# Patient Record
Sex: Female | Born: 1990 | Race: Black or African American | Hispanic: No | Marital: Single | Smoking: Former smoker
Health system: Southern US, Community
[De-identification: ages and names within clinical notes are randomized; demographics above are authoritative.]

## PROBLEM LIST (undated history)

## (undated) HISTORY — PX: TONSILLECTOMY: SUR1361

## (undated) HISTORY — PX: ANTERIOR CRUCIATE LIGAMENT REPAIR: SHX115

---

## 2012-02-23 ENCOUNTER — Encounter (HOSPITAL_COMMUNITY): Payer: Self-pay | Admitting: *Deleted

## 2012-02-23 ENCOUNTER — Emergency Department (HOSPITAL_COMMUNITY): Payer: BC Managed Care – PPO

## 2012-02-23 ENCOUNTER — Emergency Department (HOSPITAL_COMMUNITY)
Admission: EM | Admit: 2012-02-23 | Discharge: 2012-02-24 | Disposition: A | Discharge status: Home / Self Care | Payer: BC Managed Care – PPO | Attending: Emergency Medicine | Admitting: Emergency Medicine

## 2012-02-23 DIAGNOSIS — Y939 Activity, unspecified: Secondary | ICD-10-CM | POA: Insufficient documentation

## 2012-02-23 DIAGNOSIS — Y9210 Unspecified residential institution as the place of occurrence of the external cause: Secondary | ICD-10-CM | POA: Insufficient documentation

## 2012-02-23 DIAGNOSIS — W230XXA Caught, crushed, jammed, or pinched between moving objects, initial encounter: Secondary | ICD-10-CM | POA: Insufficient documentation

## 2012-02-23 DIAGNOSIS — S62639A Displaced fracture of distal phalanx of unspecified finger, initial encounter for closed fracture: Secondary | ICD-10-CM | POA: Insufficient documentation

## 2012-02-23 DIAGNOSIS — S6710XA Crushing injury of unspecified finger(s), initial encounter: Secondary | ICD-10-CM | POA: Insufficient documentation

## 2012-02-23 MED ORDER — HYDROCODONE-ACETAMINOPHEN 5-325 MG PO TABS
2.0000 | ORAL_TABLET | Freq: Once | ORAL | Status: AC
  Administered 2012-02-24: 2 via ORAL
  Filled 2012-02-23: qty 2

## 2012-02-23 NOTE — ED Provider Notes (Signed)
History  Scribed for Glade Nurse, PA-C/ Lyanne Co, MD, the patient was seen in room WTR5/WTR5. This chart was scribed by Candelaria Stagers. The patient's care started at 11:21 PM   CSN: [id number]  Arrival date & time 02/23/12  2143   None     Chief Complaint  Patient presents with  . Finger Injury     The history is provided by the patient. No language interpreter was used.   Jillian Henderson is a 22 y.o. female who presents to the Emergency Department complaining of bleeding and pain to the right middle finger after slamming the finger in a dorm door earlier today.  Pt describes the pain as 8/10.  She has no other injuries.  She reports numbness to the tip of the right middle finger. Pt denies nausea, vomiting, fever, or shills.  She has taken nothing for the pain.    History reviewed. No pertinent past medical history.  Past Surgical History  Procedure Date  . Tonsillectomy   . Anterior cruciate ligament repair     No family history on file.  History  Substance Use Topics  . Smoking status: Never Smoker   . Smokeless tobacco: Not on file  . Alcohol Use: No    OB History    Grav Para Term Preterm Abortions TAB SAB Ect Mult Living                  Review of Systems  Constitutional: Negative for fever and chills.  Respiratory: Negative for shortness of breath.   Cardiovascular: Negative for chest pain.  Gastrointestinal: Negative for nausea, vomiting and abdominal pain.  Musculoskeletal: Positive for arthralgias (pain and avulsion of nail to the right middle finger).  Neurological: Negative for weakness.  All other systems reviewed and are negative.    Allergies  Review of patient's allergies indicates no known allergies.  Home Medications  No current outpatient prescriptions on file.  BP 126/77  Pulse 78  Temp 98.9 F (37.2 C) (Oral)  Resp 16  SpO2 100%  LMP 01/27/2012  Physical Exam  Nursing note and vitals reviewed. Constitutional: She is  oriented to person, place, and time. She appears well-developed and well-nourished. No distress.  HENT:  Head: Normocephalic and atraumatic.  Eyes: EOM are normal.  Neck: Neck supple. No tracheal deviation present.  Cardiovascular: Normal rate.   Pulmonary/Chest: Effort normal. No respiratory distress.  Musculoskeletal: Normal range of motion.       Avulsion of the nail plate to the left middle finger tuft.  Bruised.  Swollen.  Full ROM.  Decreased sensation to the left middle finger.  Normal sensation to all other fingers.  Distal pulses intact.    Neurological: She is alert and oriented to person, place, and time.  Skin: Skin is warm and dry.  Psychiatric: She has a normal mood and affect. Her behavior is normal.    ED Course  Procedures   DIAGNOSTIC STUDIES: Oxygen Saturation is 100% on room air, normal by my interpretation.    COORDINATION OF CARE: 10:43PM Ordered: DG Finger Middle Left   12:00AM Ordered: HYDROcodone-acetaminophen (NORCO/VICODIN) 5-325 MG per tablet 2 tablet.    Labs Reviewed - No data to display Dg Finger Middle Left  02/23/2012  *RADIOLOGY REPORT*  Clinical Data: Injury to the middle finger.  LEFT MIDDLE FINGER 2+V  Comparison: None.  Findings: Three views of the middle finger demonstrate a crush type fracture of the distal phalanx tuft.  There are no other fractures.  The fractures are mildly displaced.  IMPRESSION: Crush fracture to the middle finger tuft.   Original Report Authenticated By: Richarda Overlie, M.D.      Diagnosis: crush fracture with nail avulsion, left hand, third digit.    MDM  Pain managed in ED. Pressure irrigation performed. Laceration occurred < 8 hours prior to repair which was well tolerated. Xray shows crush fracture. Nail totally avulsed. Dr. Patria Mane consulted and performed avulsion repair to nail bed. Bulky dressing and splint applied.  Pt has no co morbidities to effect normal wound healing. Discussed suture home care w pt and  answered questions. Pt to f-u to ensure proper nail growth. Pt is hemodynamically stable w no complaints prior to dc.     Glade Nurse, PA-C 02/25/12 [phone]

## 2012-02-23 NOTE — ED Notes (Signed)
Pt slammed left hand middle finger in dorm door; nail has come off

## 2012-02-24 MED ORDER — HYDROCODONE-ACETAMINOPHEN 5-325 MG PO TABS: ORAL_TABLET | ORAL | Status: AC

## 2012-02-24 NOTE — ED Notes (Signed)
Pt reports slamming her left third digit in the door earlier this evening, pt w/ nail avulsion and severe third digit pain. Bleeding controlled at present.

## 2012-02-24 NOTE — ED Notes (Signed)
Rx given x1 Pt ambulating independently w/ steady gait on d/c in no acute distress, A&Ox4. D/c instructions reviewed w/ pt and family - pt and family deny any further questions or concerns at present.  

## 2012-02-25 NOTE — ED Provider Notes (Signed)
Medical screening examination/treatment/procedure(s) were conducted as a shared visit with non-physician practitioner(s) and myself.  I personally evaluated the patient during the encounter  Please see my other note with documentation of nerve block and nail bed repair  Lyanne Co, MD 02/25/12 [phone]

## 2012-02-25 NOTE — ED Provider Notes (Signed)
Medical screening examination/treatment/procedure(s) were conducted as a shared visit with non-physician practitioner(s) and myself.  I personally evaluated the patient during the encounter  NERVE BLOCK Performed by: Lyanne Co Consent: Verbal consent obtained. Required items: required blood products, implants, devices, and special equipment available Time out: Immediately prior to procedure a "time out" was called to verify the correct patient, procedure, equipment, support staff and site/side marked as required. Indication: nail repair Nerve block body site: digital nerves of left middle finger Preparation: Patient was prepped and draped in the usual sterile fashion. Needle gauge: 24 G Location technique: anatomical landmarks Local anesthetic: lidocaine 2% without epi Anesthetic total: 4 ml Outcome: pain improved Patient tolerance: Patient tolerated the procedure well with no immediate complications.   NAIL REPAIR Performed by: Lyanne Co Consent: Verbal consent obtained. Risks and benefits: risks, benefits and alternatives were discussed Patient identity confirmed: provided demographic data Time out performed prior to procedure Prepped and Draped in normal sterile fashion Wound explored Nail Location: left middle finger Description: complete loss of nail overlying nail bed with partial loss of nail bed, no active bleeding No Foreign Bodies seen or palpated Anesthesia: digital nerve (see note) Irrigation method: syringe Amount of cleaning: standard Repair: Foil from chromic wrapper used to approximate nail, placed under the nail fold and sutured into placed with two 5-0 chromic interrupted sutures Patient tolerance: Patient tolerated the procedure well with no immediate complications.   Lyanne Co, MD 02/25/12 [phone]

## 2013-12-31 ENCOUNTER — Encounter: Payer: Self-pay | Admitting: Neurology

## 2013-12-31 ENCOUNTER — Ambulatory Visit (INDEPENDENT_AMBULATORY_CARE_PROVIDER_SITE_OTHER): Payer: BC Managed Care – PPO | Admitting: Neurology

## 2013-12-31 VITALS — BP 112/70 | HR 72 | Temp 98.2°F | Resp 16 | Ht 67.0 in | Wt 234.4 lb

## 2013-12-31 DIAGNOSIS — G44209 Tension-type headache, unspecified, not intractable: Secondary | ICD-10-CM

## 2013-12-31 DIAGNOSIS — G510 Bell's palsy: Secondary | ICD-10-CM

## 2013-12-31 NOTE — Progress Notes (Signed)
NEUROLOGY CONSULTATION NOTE  Jillian Henderson MRN: [id number] DOB: December 18, 1990  Referring provider: Marva PandaKimberly Millsaps, NP Primary care provider:Kimberly Fredrik CoveMillsaps, NP  Reason for consult:  Bell's palsy  HISTORY OF PRESENT ILLNESS: Jillian Henderson is a 23 year old left-handed woman who presents for evaluation of Bell's palsy.  Records and MRI report and scans reviewed.  She is accompanied by her mother.  On 12/17/13, she woke up and noted right facial weakness.  She went to the ED where she was diagnosed with Bell's palsy.  She was prescribed prednisone and Valtrex.  The following day, she developed a headache on the right side in the back of her head.  It is a pressure and pounding headache, about 8/10 and not associated with other symptoms such as nausea.  It appears to be triggered by photosensitivity in her right eye.  She has been taking tramadol which helps.  She continues to have right facial weakness, making it difficult to eat, drink and speak.  She also cannot close her right eye and she will occasionally have monocular blurred vision in the morning.  She sleeps with a patch and uses eye drops during the day.  She reports right ear pain and hyperacusis on the right.  She has not noticed any change in taste.  She does not endorse facial numbness. She had an MRI of the brain performed on 12/30/13, which was unremarkable.  However, it was performed without contrast.  She denies recent fever or viral illness. She saw the optometrist yesterday and her eye looks fine.  She has a prior history of mild right-sided Bell's palsy in 4th grade, lasting only a week.  She has no history of migraine.  She has no family history of Bell's palsy or migraine.  PAST MEDICAL HISTORY: No past medical history on file.  PAST SURGICAL HISTORY: Past Surgical History  Procedure Laterality Date  . Tonsillectomy    . Anterior cruciate ligament repair      MEDICATIONS: Current Outpatient Prescriptions on File Prior  to Visit  Medication Sig Dispense Refill  . HYDROcodone-acetaminophen (NORCO/VICODIN) 5-325 MG per tablet Take 1-2 tablets every 4-6 hours as needed for pain (Patient not taking: Reported on 12/31/2013) 15 tablet 0   No current facility-administered medications on file prior to visit.    ALLERGIES: No Known Allergies  FAMILY HISTORY: Family History  Problem Relation Age of Onset  . Diabetes Father   . Asthma Brother   . Diabetes Maternal Grandmother   . Hypertension Maternal Grandmother   . Hypertension Maternal Grandfather   . Diabetes Maternal Grandfather     stomach /liver    SOCIAL HISTORY: History   Social History  . Marital Status: Single    Spouse Name: N/A    Number of Children: N/A  . Years of Education: N/A   Occupational History  . Not on file.   Social History Main Topics  . Smoking status: Never Smoker   . Smokeless tobacco: Not on file  . Alcohol Use: No     Comment: social   . Drug Use: No  . Sexual Activity: No   Other Topics Concern  . Not on file   Social History Narrative    REVIEW OF SYSTEMS: Constitutional: No fevers, chills, or sweats, no generalized fatigue, change in appetite Eyes: intermittent blurred vision in right eye Ear, nose and throat: Right hyperacusis Cardiovascular: No chest pain, palpitations Respiratory:  No shortness of breath at rest or with exertion, wheezes GastrointestinaI: No nausea,  vomiting, diarrhea, abdominal pain, fecal incontinence Genitourinary:  No dysuria, urinary retention or frequency Musculoskeletal:  No neck pain, back pain Integumentary: No rash, pruritus, skin lesions Neurological: as above Psychiatric: No depression, insomnia, anxiety Endocrine: No palpitations, fatigue, diaphoresis, mood swings, change in appetite, change in weight, increased thirst Hematologic/Lymphatic:  No anemia, purpura, petechiae. Allergic/Immunologic: no itchy/runny eyes, nasal congestion, recent allergic reactions,  rashes  PHYSICAL EXAM: Filed Vitals:   12/31/13 1046  BP: 112/70  Pulse: 72  Temp: 98.2 F (36.8 C)  Resp: 16   General: No acute distress Head:  Normocephalic/atraumatic Eyes:  fundi unremarkable, without vessel changes, exudates, hemorrhages or papilledema. Neck: supple, no paraspinal tenderness, full range of motion Back: No paraspinal tenderness Heart: regular rate and rhythm Lungs: Clear to auscultation bilaterally. Vascular: No carotid bruits. Neurological Exam: Mental status: alert and oriented to person, place, and time, recent and remote memory intact, fund of knowledge intact, attention and concentration intact, speech fluent and not dysarthric, language intact. Cranial nerves: CN I: not tested CN II: pupils equal, round and reactive to light, visual fields intact, fundi unremarkable, without vessel changes, exudates, hemorrhages or papilledema. CN III, IV, VI:  full range of motion, no nystagmus, no ptosis CN V: facial sensation intact CN VII: right upper and lower facial weakness CN VIII: right hyperacusis CN IX, X: gag intact, uvula midline CN XI: sternocleidomastoid and trapezius muscles intact CN XII: tongue midline Bulk & Tone: normal, no fasciculations. Motor:  5/5 throughout Sensation:  Temperature and vibration intact Deep Tendon Reflexes:  2+ throughout, toes downgoing Finger to nose testing:  No dysmetria Heel to shin:  No dysmetria Gait:  Normal station and stride.  Able to turn and walk in tandem. Romberg negative.  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  At this point, we will wait and see how you do.  Follow up in 3 months and we can decide if further testing is warranted. 2.  At night, use LacriLube in your eye before putting on the gauze and eye patch to keep your eye moist at night.  [facility].  Continue eye drops during the  day. 4.  Limit use of pain relievers to no more than 2 days out of the week if possible   Thank you for allowing me to take part in the care of this patient.  Shon MilletAdam Miyah Hampshire, DO  CC: Marva PandaKimberly Millsaps, NP

## 2013-12-31 NOTE — Patient Instructions (Addendum)
I think you have a Bell's palsy.  MRI of the brain looked okay.   1.  At this point, we will wait and see how you do.  Follow up in 3 months and we can decide if further testing is warranted. 2.  At night, use LacriLube in your eye before putting on the gauze and eye patch to keep your eye moist at night.  [facility].  Continue eye drops during the day. 4.  Limit use of pain relievers to no more than 2 days out of the week if possible

## 2014-04-04 ENCOUNTER — Encounter: Payer: Self-pay | Admitting: Neurology

## 2014-04-04 ENCOUNTER — Ambulatory Visit (INDEPENDENT_AMBULATORY_CARE_PROVIDER_SITE_OTHER): Payer: BLUE CROSS/BLUE SHIELD | Admitting: Neurology

## 2014-04-04 VITALS — BP 104/70 | HR 70 | Resp 16 | Ht 67.0 in | Wt 236.1 lb

## 2014-04-04 DIAGNOSIS — G510 Bell's palsy: Secondary | ICD-10-CM

## 2014-04-04 NOTE — Patient Instructions (Signed)
1.  I would continue eye drops and wearing a patch at night 2.  Follow up in 2 months

## 2014-04-04 NOTE — Progress Notes (Signed)
NEUROLOGY FOLLOW UP OFFICE NOTE  Jillian Henderson [id number]  HISTORY OF PRESENT ILLNESS: Jillian Henderson is a 24 year old left-handed woman who follows up for right Bell's palsy.  UPDATE: Facial weakness still present but improved.  She doesn't have as much trouble drinking or talking.  Her eye still doesn't close well.  She denies new symptoms.  No headache.  No eye pain.  Occasional blurred vision. HISTORY: On 12/17/13, she woke up and noted right facial weakness.  She went to the ED where she was diagnosed with Bell's palsy.  She was prescribed prednisone and Valtrex.  The following day, she developed a headache on the right side in the back of her head.  It is a pressure and pounding headache, about 8/10 and not associated with other symptoms such as nausea.  It appears to be triggered by photosensitivity in her right eye.  She has been taking tramadol which helps.  She continues to have right facial weakness, making it difficult to eat, drink and speak.  She also cannot close her right eye and she will occasionally have monocular blurred vision in the morning.  She sleeps with a patch and uses eye drops during the day.  She reports right ear pain and hyperacusis on the right.  She has not noticed any change in taste.  She does not endorse facial numbness. She had an MRI of the brain performed on 12/30/13, which was unremarkable.  However, it was performed without contrast.  She denies recent fever or viral illness. She saw the optometrist yesterday and her eye looks fine.  She has a prior history of mild right-sided Bell's palsy in 4th grade, lasting only a week.  She has no history of migraine.  She has no family history of Bell's palsy or migraine.  PAST MEDICAL HISTORY: No past medical history on file.  MEDICATIONS: Current Outpatient Prescriptions on File Prior to Visit  Medication Sig Dispense Refill  . HYDROcodone-acetaminophen (NORCO/VICODIN) 5-325 MG per tablet Take 1-2 tablets every 4-6  hours as needed for pain 15 tablet 0  . traMADol (ULTRAM) 50 MG tablet Take by mouth every 6 (six) hours as needed.     No current facility-administered medications on file prior to visit.    ALLERGIES: No Known Allergies  FAMILY HISTORY: Family History  Problem Relation Age of Onset  . Diabetes Father   . Asthma Brother   . Diabetes Maternal Grandmother   . Hypertension Maternal Grandmother   . Hypertension Maternal Grandfather   . Diabetes Maternal Grandfather     stomach /liver    SOCIAL HISTORY: History   Social History  . Marital Status: Single    Spouse Name: N/A  . Number of Children: N/A  . Years of Education: N/A   Occupational History  . Not on file.   Social History Main Topics  . Smoking status: Former Games developermoker  . Smokeless tobacco: Never Used  . Alcohol Use: No     Comment: social   . Drug Use: No  . Sexual Activity: No   Other Topics Concern  . Not on file   Social History Narrative    REVIEW OF SYSTEMS: Constitutional: No fevers, chills, or sweats, no generalized fatigue, change in appetite Eyes: No visual changes, double vision, eye pain Ear, nose and throat: No hearing loss, ear pain, nasal congestion, sore throat Cardiovascular: No chest pain, palpitations Respiratory:  No shortness of breath at rest or with exertion, wheezes GastrointestinaI: No nausea, vomiting, diarrhea,  abdominal pain, fecal incontinence Genitourinary:  No dysuria, urinary retention or frequency Musculoskeletal:  No neck pain, back pain Integumentary: No rash, pruritus, skin lesions Neurological: as above Psychiatric: No depression, insomnia, anxiety Endocrine: No palpitations, fatigue, diaphoresis, mood swings, change in appetite, change in weight, increased thirst Hematologic/Lymphatic:  No anemia, purpura, petechiae. Allergic/Immunologic: no itchy/runny eyes, nasal congestion, recent allergic reactions, rashes  PHYSICAL EXAM: Filed Vitals:   04/04/14 1115  BP:  104/70  Pulse: 70  Resp: 16   General: No acute distress Head:  Normocephalic/atraumatic Eyes:  Fundoscopic exam unremarkable without vessel changes, exudates, hemorrhages or papilledema. Neck: supple, no paraspinal tenderness, full range of motion Heart:  Regular rate and rhythm Lungs:  Clear to auscultation bilaterally Back: No paraspinal tenderness Neurological Exam: alert and oriented to person, place, and time. Attention span and concentration intact, recent and remote memory intact, fund of knowledge intact.  Speech fluent and not dysarthric, language intact.  Right upper and lower facial weakness, otherwise CN II-XII intact. Fundoscopic exam unremarkable without vessel changes, exudates, hemorrhages or papilledema.  Bulk and tone normal, muscle strength 5/5 throughout.  Sensation to light touch  intact.  Deep tendon reflexes 2+ throughout.  Finger to nose testing intact.  Gait normal  IMPRESSION: Right Bell's palsy.  She has shown improvement which is reassuring.  I really don't suspect another etiology since it has been improving.  She still cannot fully close her eye though.    PLAN: I would still like to evaluate her again in 2 months at the 6 month mark Recommend keeping eyes lubricated and wearing patch at night.  15 minutes spent with patient, over 50% spent discussing diagnosis and plan.  Shon Millet, DO  CC: Marva Panda, NP

## 2015-07-05 ENCOUNTER — Emergency Department (HOSPITAL_COMMUNITY)
Admission: EM | Admit: 2015-07-05 | Discharge: 2015-07-06 | Disposition: A | Discharge status: Home / Self Care | Payer: BLUE CROSS/BLUE SHIELD | Attending: Emergency Medicine | Admitting: Emergency Medicine

## 2015-07-05 DIAGNOSIS — Y999 Unspecified external cause status: Secondary | ICD-10-CM | POA: Diagnosis not present

## 2015-07-05 DIAGNOSIS — Z87891 Personal history of nicotine dependence: Secondary | ICD-10-CM | POA: Diagnosis not present

## 2015-07-05 DIAGNOSIS — Y929 Unspecified place or not applicable: Secondary | ICD-10-CM | POA: Diagnosis not present

## 2015-07-05 DIAGNOSIS — Z79891 Long term (current) use of opiate analgesic: Secondary | ICD-10-CM | POA: Insufficient documentation

## 2015-07-05 DIAGNOSIS — S20219A Contusion of unspecified front wall of thorax, initial encounter: Secondary | ICD-10-CM | POA: Insufficient documentation

## 2015-07-05 DIAGNOSIS — Y9389 Activity, other specified: Secondary | ICD-10-CM | POA: Insufficient documentation

## 2015-07-05 DIAGNOSIS — W500XXA Accidental hit or strike by another person, initial encounter: Secondary | ICD-10-CM | POA: Insufficient documentation

## 2015-07-05 DIAGNOSIS — S299XXA Unspecified injury of thorax, initial encounter: Secondary | ICD-10-CM | POA: Diagnosis present

## 2015-07-05 DIAGNOSIS — Z791 Long term (current) use of non-steroidal anti-inflammatories (NSAID): Secondary | ICD-10-CM | POA: Insufficient documentation

## 2015-07-06 ENCOUNTER — Encounter (HOSPITAL_COMMUNITY): Payer: Self-pay | Admitting: Emergency Medicine

## 2015-07-06 ENCOUNTER — Emergency Department (HOSPITAL_COMMUNITY): Payer: BLUE CROSS/BLUE SHIELD

## 2015-07-06 MED ORDER — KETOROLAC TROMETHAMINE 30 MG/ML IJ SOLN: 30.0000 mg | Freq: Once | INTRAMUSCULAR | Status: DC

## 2015-07-06 MED ORDER — IBUPROFEN 200 MG PO TABS
600.0000 mg | ORAL_TABLET | Freq: Once | ORAL | Status: AC
  Administered 2015-07-06: 600 mg via ORAL
  Filled 2015-07-06: qty 3

## 2015-07-06 MED ORDER — NAPROXEN 500 MG PO TABS: 500.0000 mg | ORAL_TABLET | Freq: Two times a day (BID) | ORAL | Status: AC
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
## 2015-07-06 NOTE — Discharge Instructions (Signed)
Chest Contusion A chest contusion is a deep bruise on your chest area. Contusions are the result of an injury that caused bleeding under the skin. A chest contusion may involve bruising of the skin, muscles, or ribs. The contusion may turn blue, purple, or yellow. Minor injuries will give you a painless contusion, but more severe contusions may stay painful and swollen for a few weeks. CAUSES  A contusion is usually caused by a blow, trauma, or direct force to an area of the body. SYMPTOMS   Swelling and redness of the injured area.  Discoloration of the injured area.  Tenderness and soreness of the injured area.  Pain. DIAGNOSIS  The diagnosis can be made by taking a history and performing a physical exam. An X-ray, CT scan, or MRI may be needed to determine if there were any associated injuries, such as broken bones (fractures) or internal injuries. TREATMENT  Often, the best treatment for a chest contusion is resting, icing, and applying cold compresses to the injured area. Deep breathing exercises may be recommended to reduce the risk of pneumonia. Over-the-counter medicines may also be recommended for pain control. HOME CARE INSTRUCTIONS   Put ice on the injured area.  Put ice in a plastic bag.  Place a towel between your skin and the bag.  Leave the ice on for 15-20 minutes, 03-04 times a day.  Only take over-the-counter or prescription medicines as directed by your caregiver. Your caregiver may recommend avoiding anti-inflammatory medicines (aspirin, ibuprofen, and naproxen) for 48 hours because these medicines may increase bruising.  Rest the injured area.  Perform deep-breathing exercises as directed by your caregiver.  Stop smoking if you smoke.  Do not lift objects over 5 pounds (2.3 kg) for 3 days or longer if recommended by your caregiver. SEEK IMMEDIATE MEDICAL CARE IF:   You have increased bruising or swelling.  You have pain that is getting worse.  You have  difficulty breathing.  You have dizziness, weakness, or fainting.  You have blood in your urine or stool.  You cough up or vomit blood.  Your swelling or pain is not relieved with medicines. MAKE SURE YOU:   Understand these instructions.  Will watch your condition.  Will get help right away if you are not doing well or get worse.   This information is not intended to replace advice given to you by your health care provider. Make sure you discuss any questions you have with your health care provider.   Document Released: 10/12/2000 Document Revised: 10/12/2011 Document Reviewed: 07/11/2011 [employer] Interactive Patient Education 2016 [employer]  

## 2017-09-09 IMAGING — CR DG CHEST 2V
2 series · 2 of 2 positions shown · non-contrast
Comparison: None.

CLINICAL DATA: Chest wall injury with sternal pain. Initial
encounter.

EXAM:
CHEST  2 VIEW

[w chest pa]
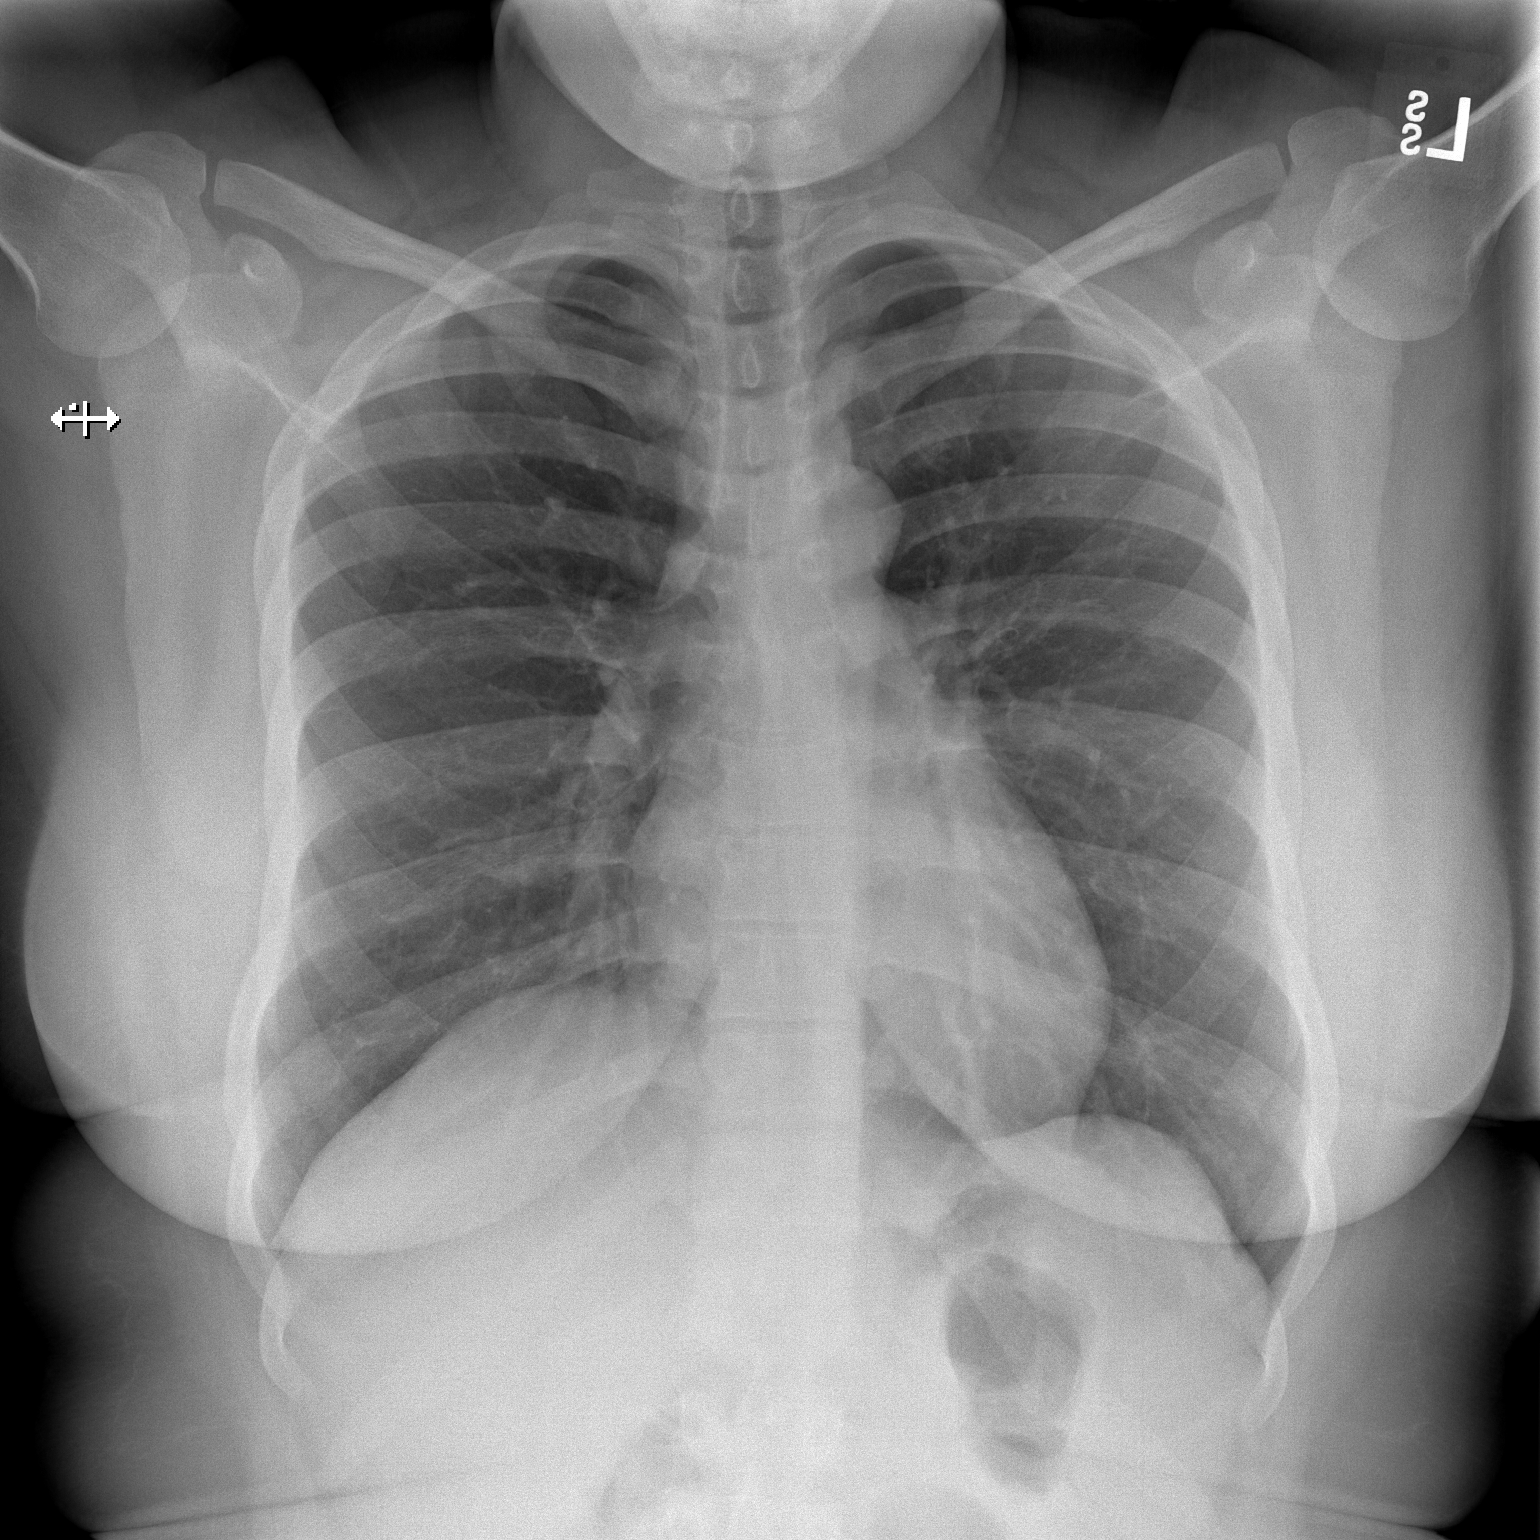

[w chest lat]
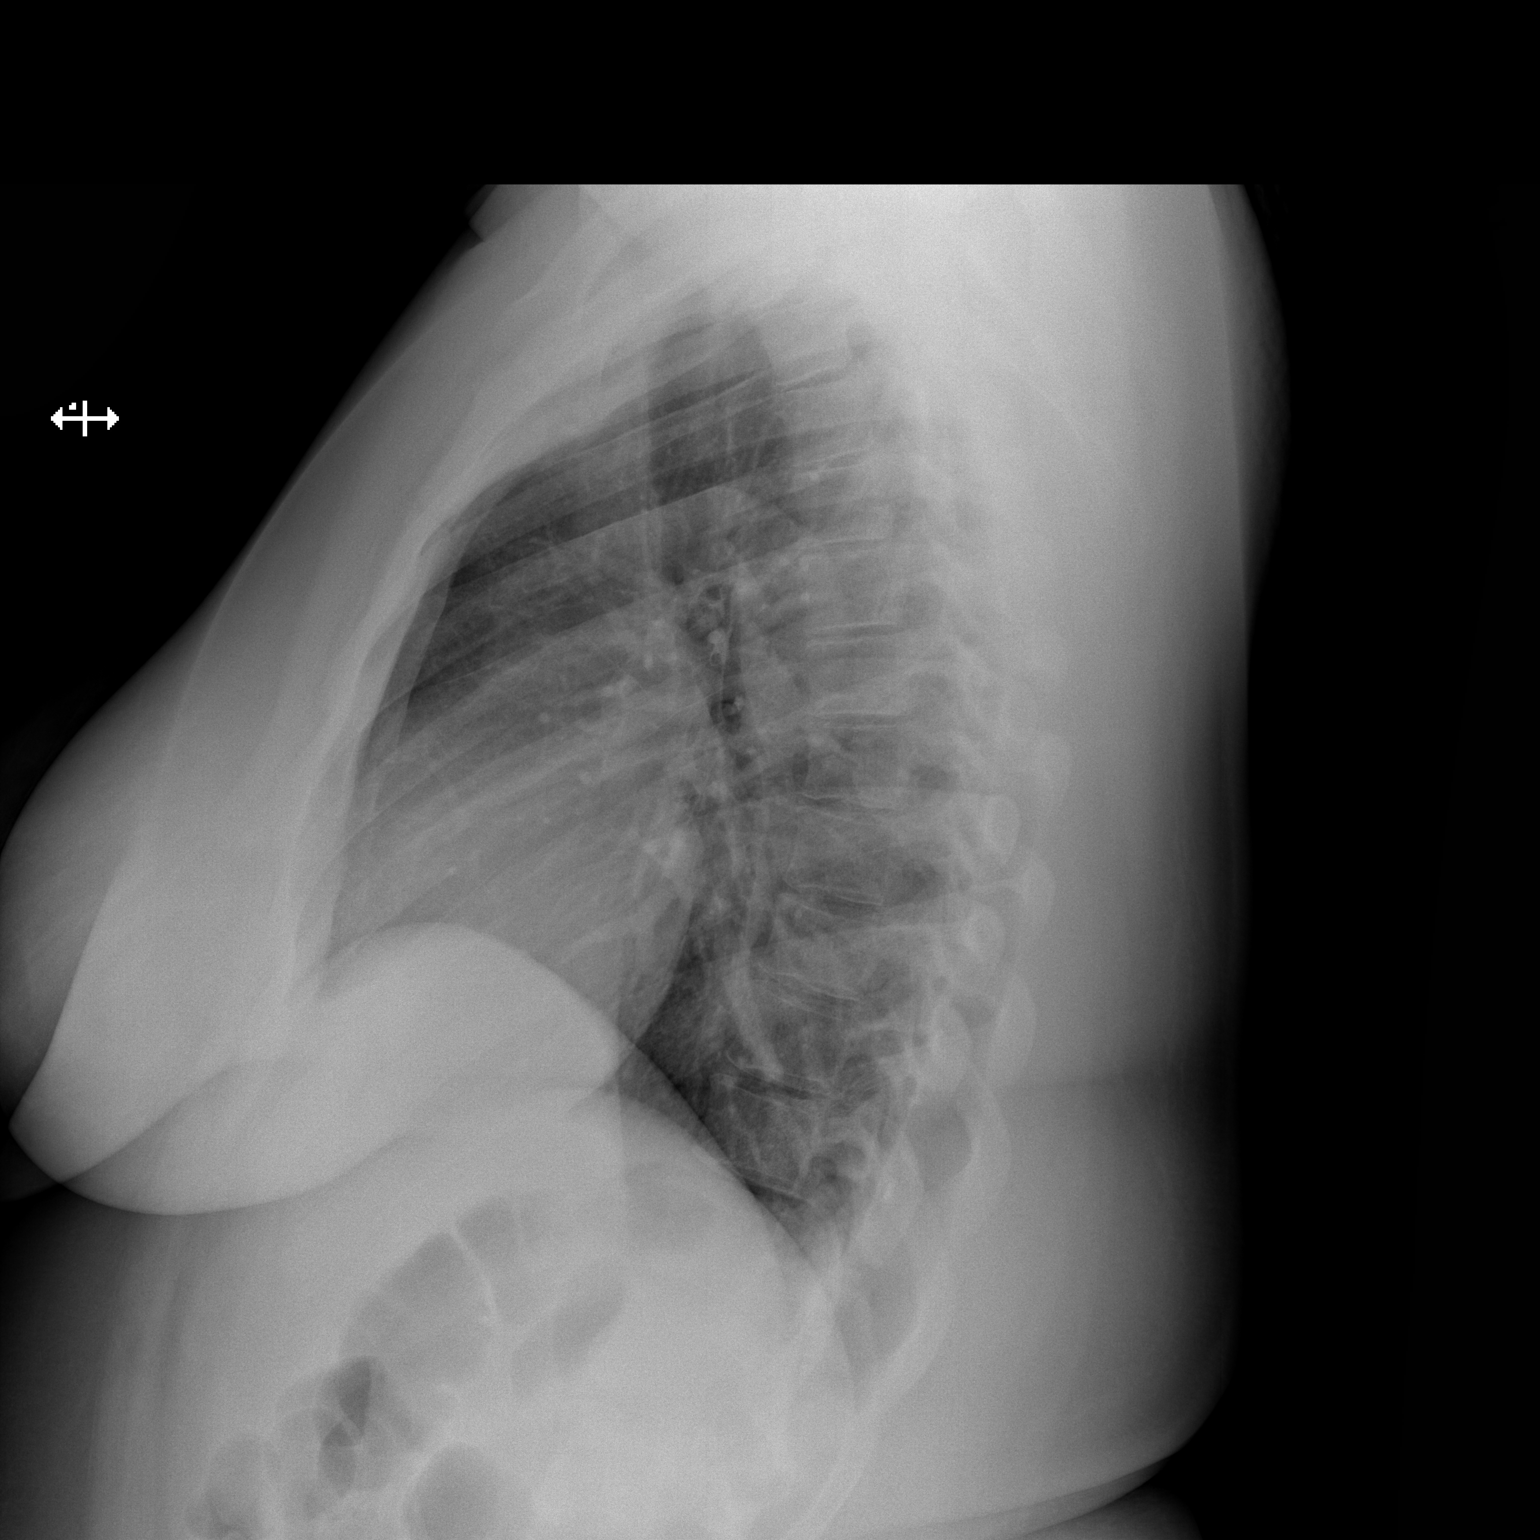

[2 of 2 positions shown; findings below may reference images not displayed]

FINDINGS: No visible fracture or retrosternal hematoma. Normal heart size and
mediastinal contours. No hemothorax, pneumothorax, or lung
contusion.
IMPRESSION: Negative chest.
